# Patient Record
Sex: Male | Born: 1995 | Race: Black or African American | Hispanic: No | Marital: Single | Smoking: Never smoker
Health system: Southern US, Community
[De-identification: ages and names within clinical notes are randomized; demographics above are authoritative.]

---
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
## 2015-04-25 IMAGING — CR DG CHEST 2V
2 series · 2 of 2 positions shown · non-contrast
Comparison: None.

CLINICAL DATA: Chest pain

EXAM:
CHEST  2 VIEW

[w chest pa]
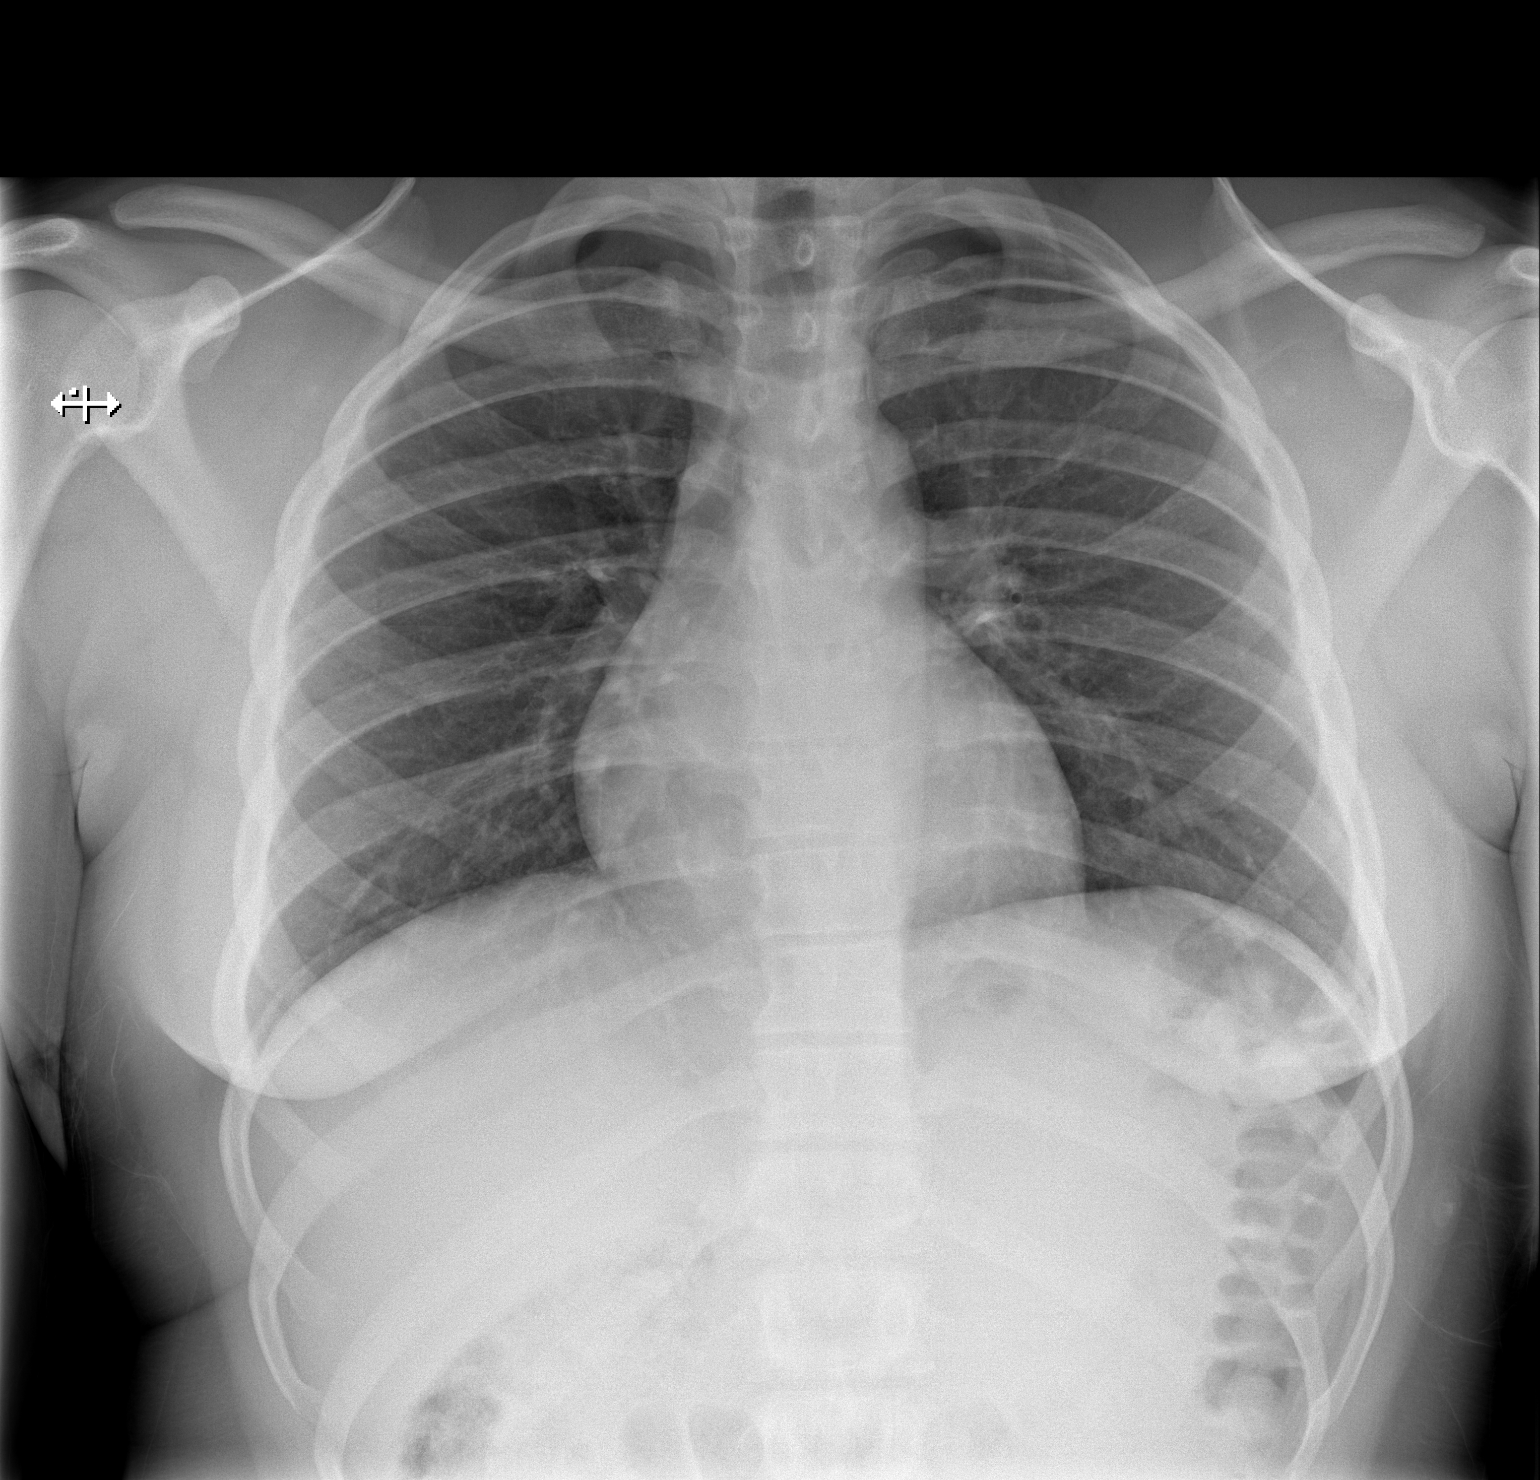

[w chest lat]
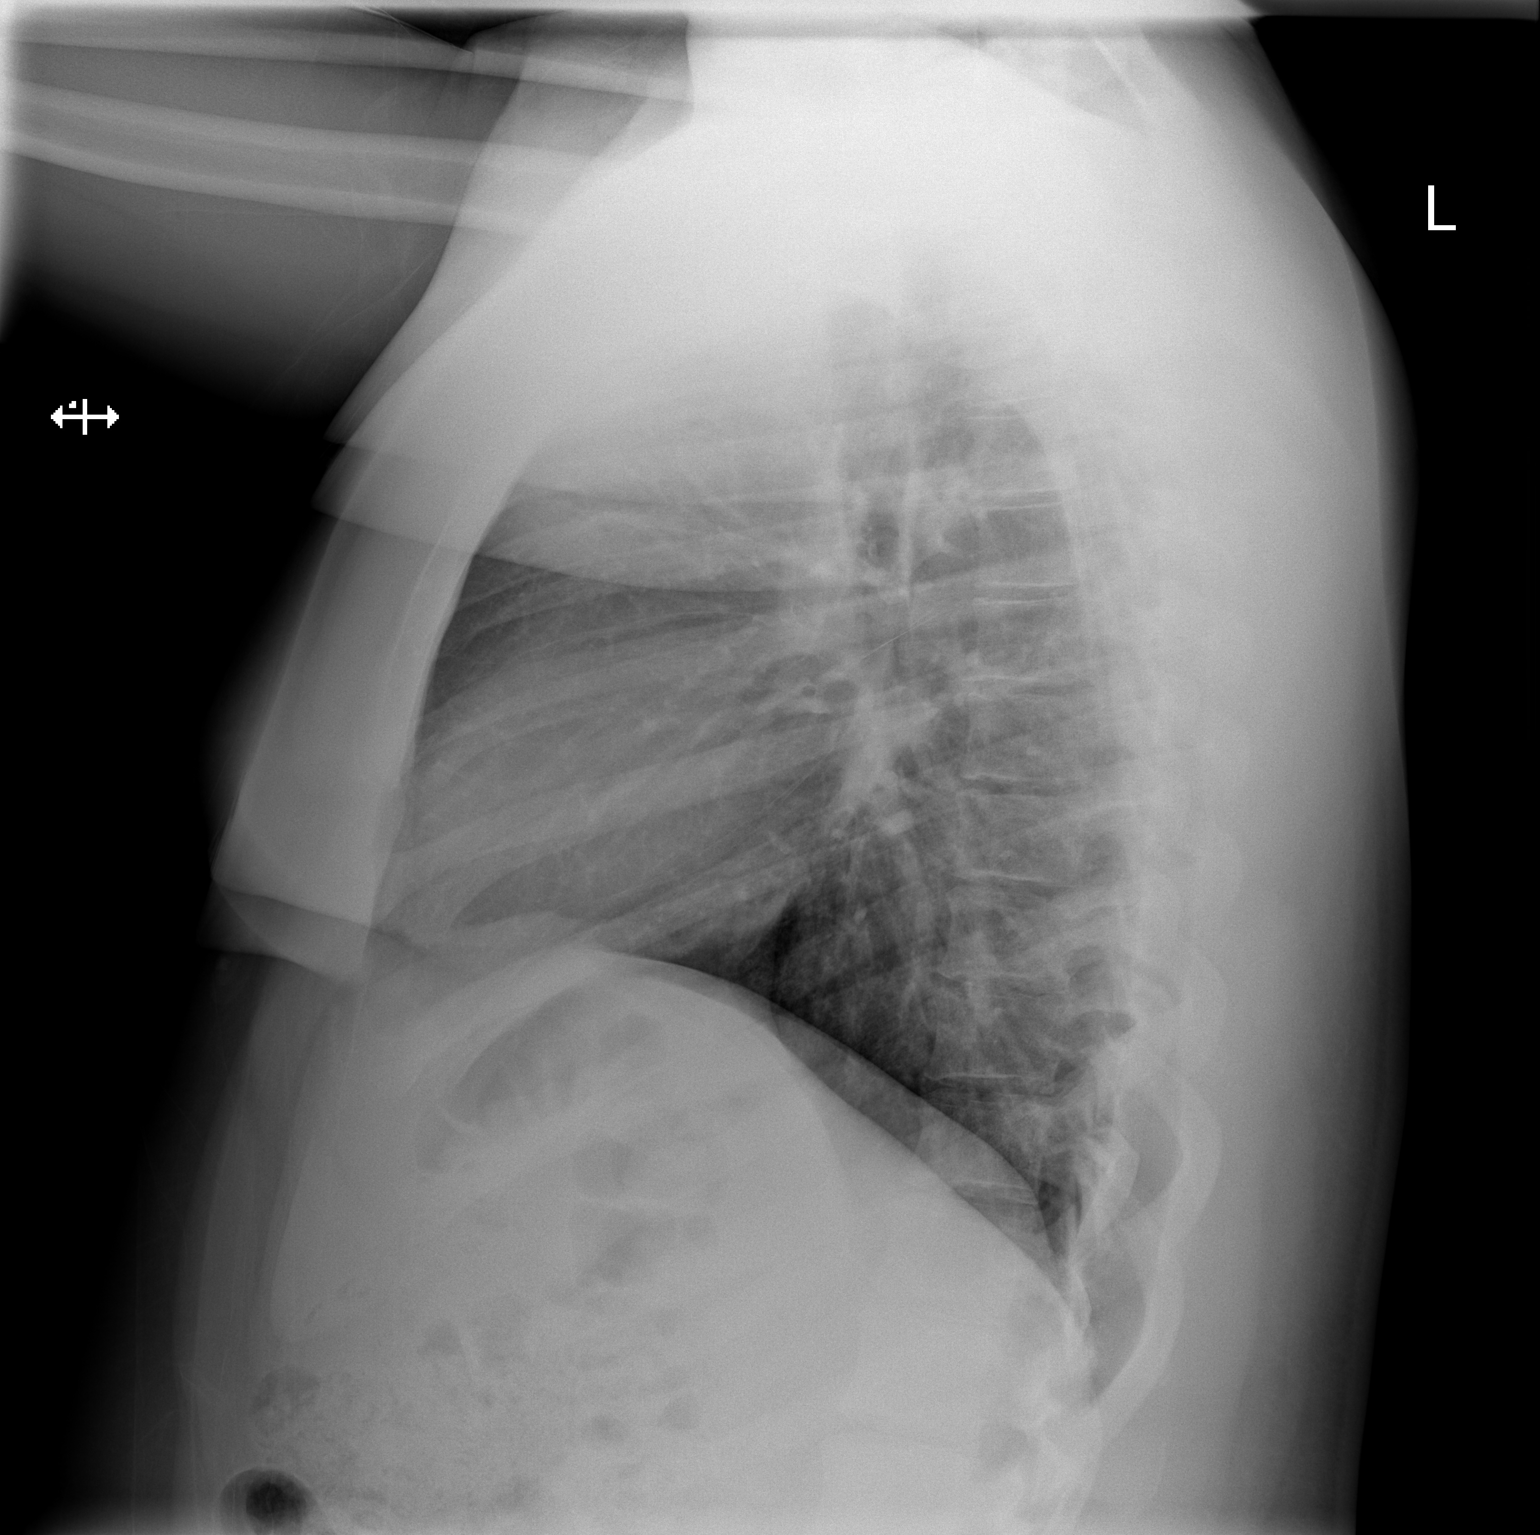

[2 of 2 positions shown; findings below may reference images not displayed]

FINDINGS: Normal heart size and mediastinal contours. No acute infiltrate or
edema. No effusion or pneumothorax. No acute osseous findings.
IMPRESSION: No active cardiopulmonary disease.
